# Patient Record
Sex: Male | Born: 1980 | Race: White | Hispanic: No | Marital: Single
Health system: Southern US, Community
[De-identification: ages and names within clinical notes are randomized; demographics above are authoritative.]

## PROBLEM LIST (undated history)

## (undated) DIAGNOSIS — I10 Essential (primary) hypertension: Secondary | ICD-10-CM

## (undated) HISTORY — PX: CLAVICLE SURGERY: SHX598

---
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
## 1999-09-30 HISTORY — PX: MENISCUS REPAIR: SHX5179

## 2006-12-12 ENCOUNTER — Emergency Department (HOSPITAL_COMMUNITY): Admission: EM | Admit: 2006-12-12 | Discharge: 2006-12-12 | Payer: Self-pay | Admitting: Family Medicine

## 2013-10-28 ENCOUNTER — Ambulatory Visit: Payer: Self-pay | Admitting: Internal Medicine

## 2013-10-28 LAB — IRON AND TIBC
IRON BIND. CAP.(TOTAL): 330 ug/dL (ref 250–450)
IRON: 53 ug/dL — AB (ref 65–175)
Iron Saturation: 16 %
UNBOUND IRON-BIND. CAP.: 277 ug/dL

## 2013-10-28 LAB — HEPATIC FUNCTION PANEL A (ARMC)
ALT: 49 U/L (ref 12–78)
AST: 28 U/L (ref 15–37)
Albumin: 3.6 g/dL (ref 3.4–5.0)
Alkaline Phosphatase: 75 U/L
Bilirubin, Direct: 0.1 mg/dL (ref 0.00–0.20)
Bilirubin,Total: 0.4 mg/dL (ref 0.2–1.0)
Total Protein: 7.5 g/dL (ref 6.4–8.2)

## 2013-10-28 LAB — CBC CANCER CENTER
BASOS PCT: 0.3 %
Basophil #: 0 x10 3/mm (ref 0.0–0.1)
EOS ABS: 0.1 x10 3/mm (ref 0.0–0.7)
Eosinophil %: 1.3 %
HCT: 38.8 % — ABNORMAL LOW (ref 40.0–52.0)
HGB: 13.5 g/dL (ref 13.0–18.0)
Lymphocyte #: 1.9 x10 3/mm (ref 1.0–3.6)
Lymphocyte %: 20.2 %
MCH: 30.6 pg (ref 26.0–34.0)
MCHC: 34.9 g/dL (ref 32.0–36.0)
MCV: 88 fL (ref 80–100)
MONOS PCT: 5.2 %
Monocyte #: 0.5 x10 3/mm (ref 0.2–1.0)
Neutrophil #: 6.7 x10 3/mm — ABNORMAL HIGH (ref 1.4–6.5)
Neutrophil %: 73 %
PLATELETS: 276 x10 3/mm (ref 150–440)
RBC: 4.42 10*6/uL (ref 4.40–5.90)
RDW: 14 % (ref 11.5–14.5)
WBC: 9.2 x10 3/mm (ref 3.8–10.6)

## 2013-10-28 LAB — FERRITIN: FERRITIN (ARMC): 219 ng/mL (ref 8–388)

## 2013-10-28 LAB — CALCIUM: CALCIUM: 9.1 mg/dL (ref 8.5–10.1)

## 2013-10-28 LAB — LACTATE DEHYDROGENASE: LDH: 183 U/L (ref 85–241)

## 2013-10-28 LAB — SEDIMENTATION RATE: Erythrocyte Sed Rate: 25 mm/hr — ABNORMAL HIGH (ref 0–15)

## 2013-10-28 LAB — CREATININE, SERUM
Creatinine: 1.1 mg/dL (ref 0.60–1.30)
EGFR (African American): >60

## 2013-10-30 ENCOUNTER — Ambulatory Visit: Payer: Self-pay | Admitting: Internal Medicine

## 2013-10-31 LAB — PROT IMMUNOELECTROPHORES(ARMC)

## 2013-11-27 ENCOUNTER — Ambulatory Visit: Payer: Self-pay | Admitting: Internal Medicine

## 2014-03-29 ENCOUNTER — Ambulatory Visit: Payer: Self-pay | Admitting: Internal Medicine

## 2014-04-29 ENCOUNTER — Ambulatory Visit: Payer: Self-pay | Admitting: Internal Medicine

## 2014-05-11 ENCOUNTER — Ambulatory Visit: Payer: Self-pay | Admitting: Internal Medicine

## 2014-05-11 LAB — CBC CANCER CENTER
BASOS ABS: 0 x10 3/mm (ref 0.0–0.1)
Basophil %: 0.5 %
EOS PCT: 2.3 %
Eosinophil #: 0.1 x10 3/mm (ref 0.0–0.7)
HCT: 40.9 % (ref 40.0–52.0)
HGB: 13.9 g/dL (ref 13.0–18.0)
LYMPHS ABS: 1.7 x10 3/mm (ref 1.0–3.6)
LYMPHS PCT: 27.4 %
MCH: 30.1 pg (ref 26.0–34.0)
MCHC: 33.9 g/dL (ref 32.0–36.0)
MCV: 89 fL (ref 80–100)
Monocyte #: 0.3 x10 3/mm (ref 0.2–1.0)
Monocyte %: 4.8 %
Neutrophil #: 4.1 x10 3/mm (ref 1.4–6.5)
Neutrophil %: 65 %
Platelet: 255 x10 3/mm (ref 150–440)
RBC: 4.62 10*6/uL (ref 4.40–5.90)
RDW: 13.9 % (ref 11.5–14.5)
WBC: 6.3 x10 3/mm (ref 3.8–10.6)

## 2014-05-11 LAB — IRON AND TIBC
IRON BIND. CAP.(TOTAL): 313 ug/dL (ref 250–450)
Iron Saturation: 31 %
Iron: 97 ug/dL (ref 65–175)
Unbound Iron-Bind.Cap.: 216 ug/dL

## 2014-05-11 LAB — SEDIMENTATION RATE: Erythrocyte Sed Rate: 16 mm/hr — ABNORMAL HIGH (ref 0–15)

## 2014-05-11 LAB — FERRITIN: Ferritin (ARMC): 217 ng/mL (ref 8–388)

## 2014-05-30 ENCOUNTER — Ambulatory Visit: Payer: Self-pay | Admitting: Internal Medicine

## 2019-11-26 ENCOUNTER — Ambulatory Visit: Payer: Self-pay | Attending: Internal Medicine

## 2019-11-26 DIAGNOSIS — Z23 Encounter for immunization: Secondary | ICD-10-CM | POA: Insufficient documentation

## 2019-11-26 NOTE — Progress Notes (Signed)
   Covid-19 Vaccination Clinic  Name:  David Pope    MRN: [id number] DOB: 07-02-1981  11/26/2019  David Pope was observed post Covid-19 immunization for 15 minutes without incidence. He was provided with Vaccine Information Sheet and instruction to access the V-Safe system.   David Pope was instructed to call 911 with any severe reactions post vaccine: [name] Kitchen Difficulty breathing  . Swelling of your face and throat  . A fast heartbeat  . A bad rash all over your body  . Dizziness and weakness    Immunizations Administered    Name Date Dose VIS Date Route   Moderna COVID-19 Vaccine 11/26/2019  2:39 PM 0.5 mL 08/30/2019 Intramuscular   Manufacturer: Moderna   Lot: [id number]   [place]:[id number]

## 2019-12-24 ENCOUNTER — Ambulatory Visit: Payer: Self-pay | Attending: Internal Medicine

## 2019-12-24 DIAGNOSIS — Z23 Encounter for immunization: Secondary | ICD-10-CM

## 2019-12-24 NOTE — Progress Notes (Signed)
   Covid-19 Vaccination Clinic  Name:  David Pope    MRN: [id number] DOB: [birth year]  12/24/2019  [name] was observed post Covid-19 immunization for 15 minutes without incident. He was provided with Vaccine Information Sheet and instruction to access the V-Safe system.   [name] was instructed to call 911 with any severe reactions post vaccine: [name] Kitchen Difficulty breathing  . Swelling of face and throat  . A fast heartbeat  . A bad rash all over body  . Dizziness and weakness   Immunizations Administered    Name Date Dose VIS Date Route   Moderna COVID-19 Vaccine 12/24/2019 11:50 AM 0.5 mL 08/30/2019 Intramuscular   Manufacturer: Levan Hurst   Lot: [id number]   [place]: [id number]

## 2020-03-07 ENCOUNTER — Other Ambulatory Visit: Payer: Self-pay

## 2020-03-07 ENCOUNTER — Ambulatory Visit: Payer: BC Managed Care – PPO | Admitting: Dermatology

## 2020-03-07 DIAGNOSIS — Z1283 Encounter for screening for malignant neoplasm of skin: Secondary | ICD-10-CM

## 2020-03-07 DIAGNOSIS — D2362 Other benign neoplasm of skin of left upper limb, including shoulder: Secondary | ICD-10-CM | POA: Diagnosis not present

## 2020-03-07 DIAGNOSIS — L578 Other skin changes due to chronic exposure to nonionizing radiation: Secondary | ICD-10-CM

## 2020-03-07 DIAGNOSIS — D485 Neoplasm of uncertain behavior of skin: Secondary | ICD-10-CM

## 2020-03-07 DIAGNOSIS — L738 Other specified follicular disorders: Secondary | ICD-10-CM

## 2020-03-07 DIAGNOSIS — D239 Other benign neoplasm of skin, unspecified: Secondary | ICD-10-CM

## 2020-03-07 DIAGNOSIS — D225 Melanocytic nevi of trunk: Secondary | ICD-10-CM

## 2020-03-07 DIAGNOSIS — L814 Other melanin hyperpigmentation: Secondary | ICD-10-CM

## 2020-03-07 DIAGNOSIS — D1722 Benign lipomatous neoplasm of skin and subcutaneous tissue of left arm: Secondary | ICD-10-CM | POA: Diagnosis not present

## 2020-03-07 DIAGNOSIS — D229 Melanocytic nevi, unspecified: Secondary | ICD-10-CM | POA: Diagnosis not present

## 2020-03-07 DIAGNOSIS — L821 Other seborrheic keratosis: Secondary | ICD-10-CM

## 2020-03-07 DIAGNOSIS — D1801 Hemangioma of skin and subcutaneous tissue: Secondary | ICD-10-CM

## 2020-03-07 HISTORY — DX: Melanocytic nevi, unspecified: D22.9

## 2020-03-07 NOTE — Progress Notes (Signed)
   New Patient Visit  Subjective  David Pope is a 39 y.o. male who presents for the following: Annual Exam (UBSE - No history of abnormal moles or skin cancer. He was adopted so he is not aware of family history.). The patient presents for Upper Body Skin Exam (UBSE) for skin cancer screening and mole check.  The following portions of the chart were reviewed this encounter and updated as appropriate:  Allergies  Meds  Problems  Med Hx  Surg Hx  Fam Hx      Review of Systems:  No other skin or systemic complaints except as noted in HPI or Assessment and Plan.  Objective  Well appearing patient in no apparent distress; mood and affect are within normal limits.  All skin waist up examined.  Objective  Left upper arm: Firm pink/brown papulenodule with dimple sign.   Objective  Left post deltoid: Rubbery nodule.  Objective  Above right lat waistline: 0.8 x 0.4 cm dark brown macule   Assessment & Plan    Sebaceous Hyperplasia - Small yellow papules with a central dell - Benign - Observe  Lentigines - Scattered tan macules - Discussed due to sun exposure - Benign, observe - Call for any changes  Seborrheic Keratoses - Stuck-on, waxy, tan-brown papules and plaques  - Discussed benign etiology and prognosis. - Observe - Call for any changes  Melanocytic Nevi - Tan-brown and/or pink-flesh-colored symmetric macules and papules - Benign appearing on exam today - Observation - Call clinic for new or changing moles - Recommend daily use of broad spectrum spf 30+ sunscreen to sun-exposed areas.   Hemangiomas - Red papules - Discussed benign nature - Observe - Call for any changes  Actinic Damage - diffuse scaly erythematous macules with underlying dyspigmentation - Recommend daily broad spectrum sunscreen SPF 30+ to sun-exposed areas, reapply every 2 hours as needed.  - Call for new or changing lesions.  Skin cancer screening performed  today.   Dermatofibroma Left upper arm  Benign, observe.    Lipoma of left upper extremity Left post deltoid  Lipoma vs cyst - Benign appearing. Observe.  Neoplasm of uncertain behavior of skin Above right lat waistline  Epidermal / dermal shaving  Lesion length (cm):  0.8 Lesion width (cm):  0.4 Margin per side (cm):  0.2 Total excision diameter (cm):  1.2 Informed consent: discussed and consent obtained   Timeout: patient name, date of birth, surgical site, and procedure verified   Procedure prep:  Patient was prepped and draped in usual sterile fashion Prep type:  Isopropyl alcohol Anesthesia: the lesion was anesthetized in a standard fashion   Anesthetic:  1% lidocaine w/ epinephrine 1-100,000 buffered w/ 8.4% NaHCO3 Instrument used: flexible razor blade   Hemostasis achieved with: pressure, aluminum chloride and electrodesiccation   Outcome: patient tolerated procedure well   Post-procedure details: sterile dressing applied and wound care instructions given   Dressing type: bandage and petrolatum    Specimen 1 - Surgical pathology Differential Diagnosis: Nevus vs dysplastic nevus Check Margins: No 0.8 x 0.4 cm dark brown macule  Skin cancer screening  Return in 1 year (on 03/07/2021).   I, Ashok Cordia, CMA, am acting as scribe for Sarina Ser, MD .  Documentation: I have reviewed the above documentation for accuracy and completeness, and I agree with the above.  Sarina Ser, MD

## 2020-03-07 NOTE — Patient Instructions (Signed)

## 2020-03-13 ENCOUNTER — Encounter: Payer: Self-pay | Admitting: Dermatology

## 2020-03-13 ENCOUNTER — Telehealth: Payer: Self-pay

## 2020-03-13 NOTE — Telephone Encounter (Signed)
Left message on voicemail to return my call.  

## 2020-03-13 NOTE — Telephone Encounter (Signed)
-----   Message from Ralene Bathe, MD sent at 03/12/2020  6:06 PM EDT ----- Skin , above right lat waistline DYSPLASTIC NEVUS WITH MODERATE TO SEVERE ATYPIA  Dysplastic Moderate to severe May need additional procedure. Schedule appt to recheck in 3-6 mos

## 2020-03-20 ENCOUNTER — Telehealth: Payer: Self-pay

## 2020-03-20 NOTE — Telephone Encounter (Signed)
-----   Message from Ralene Bathe, MD sent at 03/12/2020  6:06 PM EDT ----- Skin , above right lat waistline DYSPLASTIC NEVUS WITH MODERATE TO SEVERE ATYPIA  Dysplastic Moderate to severe May need additional procedure. Schedule appt to recheck in 3-6 mos

## 2020-03-20 NOTE — Telephone Encounter (Signed)
Patient informed of pathology results and appointment scheduled.  °

## 2020-08-21 ENCOUNTER — Encounter: Payer: Self-pay | Admitting: Dermatology

## 2020-08-21 ENCOUNTER — Ambulatory Visit: Payer: BC Managed Care – PPO | Admitting: Dermatology

## 2020-08-21 ENCOUNTER — Other Ambulatory Visit: Payer: Self-pay

## 2020-08-21 DIAGNOSIS — D229 Melanocytic nevi, unspecified: Secondary | ICD-10-CM

## 2020-08-21 DIAGNOSIS — L821 Other seborrheic keratosis: Secondary | ICD-10-CM

## 2020-08-21 DIAGNOSIS — D2362 Other benign neoplasm of skin of left upper limb, including shoulder: Secondary | ICD-10-CM | POA: Diagnosis not present

## 2020-08-21 DIAGNOSIS — Z1283 Encounter for screening for malignant neoplasm of skin: Secondary | ICD-10-CM

## 2020-08-21 DIAGNOSIS — L814 Other melanin hyperpigmentation: Secondary | ICD-10-CM | POA: Diagnosis not present

## 2020-08-21 DIAGNOSIS — D239 Other benign neoplasm of skin, unspecified: Secondary | ICD-10-CM

## 2020-08-21 DIAGNOSIS — D492 Neoplasm of unspecified behavior of bone, soft tissue, and skin: Secondary | ICD-10-CM

## 2020-08-21 DIAGNOSIS — Z86018 Personal history of other benign neoplasm: Secondary | ICD-10-CM

## 2020-08-21 DIAGNOSIS — D1800 Hemangioma unspecified site: Secondary | ICD-10-CM

## 2020-08-21 DIAGNOSIS — L578 Other skin changes due to chronic exposure to nonionizing radiation: Secondary | ICD-10-CM

## 2020-08-21 DIAGNOSIS — D2272 Melanocytic nevi of left lower limb, including hip: Secondary | ICD-10-CM | POA: Diagnosis not present

## 2020-08-21 MED ORDER — MUPIROCIN 2 % EX OINT: TOPICAL_OINTMENT | CUTANEOUS | 1 refills | Status: AC

## 2020-08-21 NOTE — Progress Notes (Signed)
Follow-Up Visit   Subjective  David Pope is a 39 y.o. male who presents for the following: Follow-up (above R lateral waistline hx of Dysplastic nevus ) and Annual Exam (Pt present for TBSe ). The patient presents for Total-Body Skin Exam (TBSE) for skin cancer screening and mole check.  The following portions of the chart were reviewed this encounter and updated as appropriate:  Allergies  Meds  Problems  Med Hx  Surg Hx  Fam Hx     Review of Systems:  No other skin or systemic complaints except as noted in HPI or Assessment and Plan.  Objective  Well appearing patient in no apparent distress; mood and affect are within normal limits.  A full examination was performed including scalp, head, eyes, ears, nose, lips, neck, chest, axillae, abdomen, back, buttocks, bilateral upper extremities, bilateral lower extremities, hands, feet, fingers, toes, fingernails, and toenails. All findings within normal limits unless otherwise noted below.  Objective  Left bicep,: Firm pink/brown papulenodule with dimple sign.   Objective  above Right lateral waistline: Scar with no evidence of recurrence.   Objective  Left dorsum great toe: 0.4 cm irregular brown macule         Assessment & Plan  Dermatofibroma Left bicep,  Benign-appearing.  Observation.  Call clinic for new or changing moles.  Recommend daily use of broad spectrum spf 30+ sunscreen to sun-exposed areas.    History of dysplastic nevus above Right lateral waistline  Clear. Observe for recurrence. Call clinic for new or changing lesions.  Recommend regular skin exams, daily broad-spectrum spf 30+ sunscreen use, and photoprotection.     Ordered Medications: mupirocin ointment (BACTROBAN) 2 %  Neoplasm of skin Left dorsum great toe  Epidermal / dermal shaving  Lesion diameter (cm):  0.4 Informed consent: discussed and consent obtained   Timeout: patient name, date of birth, surgical site, and procedure  verified   Procedure prep:  Patient was prepped and draped in usual sterile fashion Prep type:  Isopropyl alcohol Anesthesia: the lesion was anesthetized in a standard fashion   Anesthetic:  1% lidocaine w/ epinephrine 1-100,000 buffered w/ 8.4% NaHCO3 Hemostasis achieved with: pressure, aluminum chloride and electrodesiccation   Outcome: patient tolerated procedure well   Post-procedure details: sterile dressing applied and wound care instructions given   Dressing type: bandage and petrolatum    Specimen 1 - Surgical pathology Differential Diagnosis: R/O Dysplastic nevus  Check Margins: No 0.4 cm irregular brown macule  Skin cancer screening  Lentigines - Scattered tan macules - Discussed due to sun exposure - Benign, observe - Call for any changes  Seborrheic Keratoses - Stuck-on, waxy, tan-brown papules and plaques  - Discussed benign etiology and prognosis. - Observe - Call for any changes  Melanocytic Nevi - Tan-brown and/or pink-flesh-colored symmetric macules and papules - Benign appearing on exam today - Observation - Call clinic for new or changing moles - Recommend daily use of broad spectrum spf 30+ sunscreen to sun-exposed areas.   Hemangiomas - Red papules - Discussed benign nature - Observe - Call for any changes  Actinic Damage - Chronic, secondary to cumulative UV/sun exposure - diffuse scaly erythematous macules with underlying dyspigmentation - Recommend daily broad spectrum sunscreen SPF 30+ to sun-exposed areas, reapply every 2 hours as needed.  - Call for new or changing lesions.  Skin cancer screening performed today.  Return in about 1 year (around 08/21/2021) for TBSE.  I, Marye Round, CMA, am acting as Education administrator for [employer]  David Massed, MD .  Documentation: I have reviewed the above documentation for accuracy and completeness, and I agree with the above.  David Ser, MD

## 2020-08-21 NOTE — Patient Instructions (Signed)

## 2020-08-29 ENCOUNTER — Telehealth: Payer: Self-pay

## 2020-08-29 ENCOUNTER — Encounter: Payer: Self-pay | Admitting: Dermatology

## 2020-08-29 NOTE — Telephone Encounter (Signed)
-----   Message from Ralene Bathe, MD sent at 08/28/2020  9:21 AM EST ----- Diagnosis Skin , left dorsum great toe MELANOCYTIC NEVUS, INTRADERMAL TYPE  Benign mole No further treatment needed

## 2020-08-29 NOTE — Telephone Encounter (Signed)
Left message on voicemail to return my call.  

## 2020-09-11 ENCOUNTER — Telehealth: Payer: Self-pay

## 2020-09-11 NOTE — Telephone Encounter (Signed)
-----   Message from Ralene Bathe, MD sent at 08/28/2020  9:21 AM EST ----- Diagnosis Skin , left dorsum great toe MELANOCYTIC NEVUS, INTRADERMAL TYPE  Benign mole No further treatment needed

## 2020-09-11 NOTE — Telephone Encounter (Signed)
Left message on voicemail to return my call.  

## 2020-09-18 ENCOUNTER — Telehealth: Payer: Self-pay

## 2020-09-18 NOTE — Telephone Encounter (Signed)
-----   Message from David C Kowalski, MD sent at 08/28/2020  9:21 AM EST ----- °Diagnosis °Skin , left dorsum great toe °MELANOCYTIC NEVUS, INTRADERMAL TYPE ° °Benign mole °No further treatment needed °

## 2020-09-18 NOTE — Telephone Encounter (Signed)
Patient informed of pathology results 

## 2021-03-07 ENCOUNTER — Ambulatory Visit (INDEPENDENT_AMBULATORY_CARE_PROVIDER_SITE_OTHER): Payer: BC Managed Care – PPO | Admitting: Dermatology

## 2021-03-07 ENCOUNTER — Other Ambulatory Visit: Payer: Self-pay

## 2021-03-07 DIAGNOSIS — Z1283 Encounter for screening for malignant neoplasm of skin: Secondary | ICD-10-CM | POA: Diagnosis not present

## 2021-03-07 DIAGNOSIS — L821 Other seborrheic keratosis: Secondary | ICD-10-CM

## 2021-03-07 DIAGNOSIS — D2362 Other benign neoplasm of skin of left upper limb, including shoulder: Secondary | ICD-10-CM | POA: Diagnosis not present

## 2021-03-07 DIAGNOSIS — L814 Other melanin hyperpigmentation: Secondary | ICD-10-CM | POA: Diagnosis not present

## 2021-03-07 DIAGNOSIS — L578 Other skin changes due to chronic exposure to nonionizing radiation: Secondary | ICD-10-CM

## 2021-03-07 DIAGNOSIS — D1800 Hemangioma unspecified site: Secondary | ICD-10-CM

## 2021-03-07 DIAGNOSIS — D239 Other benign neoplasm of skin, unspecified: Secondary | ICD-10-CM

## 2021-03-07 DIAGNOSIS — Z86018 Personal history of other benign neoplasm: Secondary | ICD-10-CM

## 2021-03-07 DIAGNOSIS — D229 Melanocytic nevi, unspecified: Secondary | ICD-10-CM

## 2021-03-07 NOTE — Patient Instructions (Signed)

## 2021-03-07 NOTE — Progress Notes (Signed)
   Follow-Up Visit   Subjective  David Pope is a 40 y.o. male who presents for the following: Total body skin exam (Hx of dysplastic nevus R lat waistline). The patient presents for Total-Body Skin Exam (TBSE) for skin cancer screening and mole check.  The following portions of the chart were reviewed this encounter and updated as appropriate:   Allergies  Meds  Problems  Med Hx  Surg Hx  Fam Hx      Review of Systems:  No other skin or systemic complaints except as noted in HPI or Assessment and Plan.  Objective  Well appearing patient in no apparent distress; mood and affect are within normal limits.  A full examination was performed including scalp, head, eyes, ears, nose, lips, neck, chest, axillae, abdomen, back, buttocks, bilateral upper extremities, bilateral lower extremities, hands, feet, fingers, toes, fingernails, and toenails. All findings within normal limits unless otherwise noted below.  Left Upper Arm - Anterior Firm pink/brown papulenodule with dimple sign.   R lat waistline Scar with no evidence of recurrence.    Assessment & Plan   Lentigines - Scattered tan macules - Due to sun exposure - Benign-appering, observe - Recommend daily broad spectrum sunscreen SPF 30+ to sun-exposed areas, reapply every 2 hours as needed. - Call for any changes  Seborrheic Keratoses - Stuck-on, waxy, tan-brown papules and/or plaques  - Benign-appearing - Discussed benign etiology and prognosis. - Observe - Call for any changes  Melanocytic Nevi - Tan-brown and/or pink-flesh-colored symmetric macules and papules - Benign appearing on exam today - Observation - Call clinic for new or changing moles - Recommend daily use of broad spectrum spf 30+ sunscreen to sun-exposed areas.   Hemangiomas - Red papules - Discussed benign nature - Observe - Call for any changes  Actinic Damage - Chronic condition, secondary to cumulative UV/sun exposure - diffuse scaly  erythematous macules with underlying dyspigmentation - Recommend daily broad spectrum sunscreen SPF 30+ to sun-exposed areas, reapply every 2 hours as needed.  - Staying in the shade or wearing long sleeves, sun glasses (UVA+UVB protection) and wide brim hats (4-inch brim around the entire circumference of the hat) are also recommended for sun protection.  - Call for new or changing lesions.  Skin cancer screening performed today.  Dermatofibroma Left Upper Arm - Anterior  Benign, observe.    History of dysplastic nevus R lat waistline  Clear. Observe for recurrence. Call clinic for new or changing lesions.  Recommend regular skin exams, daily broad-spectrum spf 30+ sunscreen use, and photoprotection.     Related Medications mupirocin ointment (BACTROBAN) 2 % Apply to skin qd-bid  Return in about 1 year (around 03/07/2022) for TBSE, Hx of Dysplastic nevi.  I, Othelia Pulling, RMA, am acting as scribe for Sarina Ser, MD . Documentation: I have reviewed the above documentation for accuracy and completeness, and I agree with the above.  Sarina Ser, MD

## 2021-03-12 ENCOUNTER — Encounter: Payer: Self-pay | Admitting: Dermatology

## 2022-03-12 ENCOUNTER — Ambulatory Visit: Payer: BC Managed Care – PPO | Admitting: Dermatology

## 2022-03-12 ENCOUNTER — Encounter: Payer: Self-pay | Admitting: Dermatology

## 2022-03-12 DIAGNOSIS — Z86018 Personal history of other benign neoplasm: Secondary | ICD-10-CM | POA: Diagnosis not present

## 2022-03-12 DIAGNOSIS — L821 Other seborrheic keratosis: Secondary | ICD-10-CM

## 2022-03-12 DIAGNOSIS — D229 Melanocytic nevi, unspecified: Secondary | ICD-10-CM

## 2022-03-12 DIAGNOSIS — D2362 Other benign neoplasm of skin of left upper limb, including shoulder: Secondary | ICD-10-CM

## 2022-03-12 DIAGNOSIS — L814 Other melanin hyperpigmentation: Secondary | ICD-10-CM

## 2022-03-12 DIAGNOSIS — L570 Actinic keratosis: Secondary | ICD-10-CM

## 2022-03-12 DIAGNOSIS — Z1283 Encounter for screening for malignant neoplasm of skin: Secondary | ICD-10-CM | POA: Diagnosis not present

## 2022-03-12 DIAGNOSIS — L578 Other skin changes due to chronic exposure to nonionizing radiation: Secondary | ICD-10-CM

## 2022-03-12 DIAGNOSIS — L409 Psoriasis, unspecified: Secondary | ICD-10-CM | POA: Diagnosis not present

## 2022-03-12 DIAGNOSIS — D1800 Hemangioma unspecified site: Secondary | ICD-10-CM

## 2022-03-12 NOTE — Progress Notes (Signed)
Follow-Up Visit   Subjective  David Pope is a 41 y.o. male who presents for the following: Annual Exam (Skin cancer screening. Full body. Hx of dysplastic nevus with severe atypia). The patient presents for Total-Body Skin Exam (TBSE) for skin cancer screening and mole check.  The patient has spots, moles and lesions to be evaluated, some may be new or changing and the patient has concerns that these could be cancer.  The following portions of the chart were reviewed this encounter and updated as appropriate:  Allergies  Meds  Problems  Med Hx  Surg Hx  Fam Hx     Review of Systems: No other skin or systemic complaints except as noted in HPI or Assessment and Plan.  Objective  Well appearing patient in no apparent distress; mood and affect are within normal limits.  A full examination was performed including scalp, head, eyes, ears, nose, lips, neck, chest, axillae, abdomen, back, buttocks, bilateral upper extremities, bilateral lower extremities, hands, feet, fingers, toes, fingernails, and toenails. All findings within normal limits unless otherwise noted below.  right ear helix x1 Erythematous thin papules/macules with gritty scale.   B/L Elbows and knees Hyperkeratotic, lichenified plaques   Assessment & Plan   History of Dysplastic Nevus. Above right lateral waistline. Mod-severe atypia, margins free. 03/07/2020. - No evidence of recurrence today - Recommend regular full body skin exams - Recommend daily broad spectrum sunscreen SPF 30+ to sun-exposed areas, reapply every 2 hours as needed.  - Call if any new or changing lesions are noted between office visits   Lentigines - Scattered tan macules - Due to sun exposure - Benign-appearing, observe - Recommend daily broad spectrum sunscreen SPF 30+ to sun-exposed areas, reapply every 2 hours as needed. - Call for any changes  Seborrheic Keratoses - Stuck-on, waxy, tan-brown papules and/or plaques  -  Benign-appearing - Discussed benign etiology and prognosis. - Observe - Call for any changes  Melanocytic Nevi - Tan-brown and/or pink-flesh-colored symmetric macules and papules - Benign appearing on exam today - Observation - Call clinic for new or changing moles - Recommend daily use of broad spectrum spf 30+ sunscreen to sun-exposed areas.   Hemangiomas - Red papules - Discussed benign nature - Observe - Call for any changes  Actinic Damage - Chronic condition, secondary to cumulative UV/sun exposure - diffuse scaly erythematous macules with underlying dyspigmentation - Recommend daily broad spectrum sunscreen SPF 30+ to sun-exposed areas, reapply every 2 hours as needed.  - Staying in the shade or wearing long sleeves, sun glasses (UVA+UVB protection) and wide brim hats (4-inch brim around the entire circumference of the hat) are also recommended for sun protection.  - Call for new or changing lesions.  Dermatofibroma. Left upper arm, left deltoid. - Firm pink/brown papulenodule with dimple sign - Benign appearing - Call for any changes  Skin cancer screening performed today.  AK (actinic keratosis) right ear helix x1 Actinic keratoses are precancerous spots that appear secondary to cumulative UV radiation exposure/sun exposure over time. They are chronic with expected duration over 1 year. A portion of actinic keratoses will progress to squamous cell carcinoma of the skin. It is not possible to reliably predict which spots will progress to skin cancer and so treatment is recommended to prevent development of skin cancer. Recommend daily broad spectrum sunscreen SPF 30+ to sun-exposed areas, reapply every 2 hours as needed.  Recommend staying in the shade or wearing long sleeves, sun glasses (UVA+UVB protection) and wide  brim hats (4-inch brim around the entire circumference of the hat). Call for new or changing lesions.  Destruction of lesion - right ear helix  x1 Complexity: simple   Destruction method: cryotherapy   Informed consent: discussed and consent obtained   Timeout:  patient name, date of birth, surgical site, and procedure verified Lesion destroyed using liquid nitrogen: Yes   Region frozen until ice ball extended beyond lesion: Yes   Outcome: patient tolerated procedure well with no complications   Post-procedure details: wound care instructions given   Additional details:  Prior to procedure, discussed risks of blister formation, small wound, skin dyspigmentation, or rare scar following cryotherapy. Recommend Vaseline ointment to treated areas while healing.  Psoriasis B/L Elbows and knees Psoriasis is a chronic non-curable, but treatable genetic/hereditary disease that may have other systemic features affecting other organ systems such as joints (Psoriatic Arthritis). It is associated with an increased risk of inflammatory bowel disease, heart disease, non-alcoholic fatty liver disease, and depression.   No treatment necessary today. Not bothersome to patient.   Return in about 1 year (around 03/13/2023) for TBSE, HxDN.  I, Emelia Salisbury, CMA, am acting as scribe for Sarina Ser, MD. Documentation: I have reviewed the above documentation for accuracy and completeness, and I agree with the above.  Sarina Ser, MD

## 2022-03-12 NOTE — Patient Instructions (Addendum)
Cryotherapy Aftercare  Wash gently with soap and water everyday.   Apply Vaseline daily until healed.   Prior to procedure, discussed risks of blister formation, small wound, skin dyspigmentation, or rare scar following cryotherapy. Recommend Vaseline ointment to treated areas while healing.    Recommend daily broad spectrum sunscreen SPF 30+ to sun-exposed areas, reapply every 2 hours as needed. Call for new or changing lesions.  Staying in the shade or wearing long sleeves, sun glasses (UVA+UVB protection) and wide brim hats (4-inch brim around the entire circumference of the hat) are also recommended for sun protection.      Melanoma ABCDEs  Melanoma is the most dangerous type of skin cancer, and is the leading cause of death from skin disease.  You are more likely to develop melanoma if you: Have light-colored skin, light-colored eyes, or red or blond hair Spend a lot of time in the sun Tan regularly, either outdoors or in a tanning bed Have had blistering sunburns, especially during childhood Have a close family member who has had a melanoma Have atypical moles or large birthmarks  Early detection of melanoma is key since treatment is typically straightforward and cure rates are extremely high if we catch it early.   The first sign of melanoma is often a change in a mole or a new dark spot.  The ABCDE system is a way of remembering the signs of melanoma.  A for asymmetry:  The two halves do not match. B for border:  The edges of the growth are irregular. C for color:  A mixture of colors are present instead of an even brown color. D for diameter:  Melanomas are usually (but not always) greater than 21m - the size of a pencil eraser. E for evolution:  The spot keeps changing in size, shape, and color.  Please check your skin once per month between visits. You can use a small mirror in front and a large mirror behind you to keep an eye on the back side or your body.   If you  see any new or changing lesions before your next follow-up, please call to schedule a visit.  Please continue daily skin protection including broad spectrum sunscreen SPF 30+ to sun-exposed areas, reapplying every 2 hours as needed when you're outdoors.   Staying in the shade or wearing long sleeves, sun glasses (UVA+UVB protection) and wide brim hats (4-inch brim around the entire circumference of the hat) are also recommended for sun protection.     Due to recent changes in healthcare laws, you may see results of your pathology and/or laboratory studies on MyChart before the doctors have had a chance to review them. We understand that in some cases there may be results that are confusing or concerning to you. Please understand that not all results are received at the same time and often the doctors may need to interpret multiple results in order to provide you with the best plan of care or course of treatment. Therefore, we ask that you please give u[place]2 business days to thoroughly review all your results before contacting the office for clarification. Should we see a critical lab result, you will be contacted sooner.   If You Need Anything After Your Visit  If you have any questions or concerns for your doctor, please call our main line at [phone] press option 4 to reach your doctor's medical assistant. If no one answers, please leave a voicemail as directed and we will return  your call as soon as possible. Messages left after 4 pm will be answered the following business day.   You may also send [place] a message via [place]. We typically respond to MyChart messages within 1-2 business days.  For prescription refills, please ask your pharmacy to contact our office. Our fax number is [phone].  If you have an urgent issue when the clinic is closed that cannot wait until the next business day, you can page your doctor at the number below.    Please note that while we do our best to be  available for urgent issues outside of office hours, we are not available 24/7.   If you have an urgent issue and are unable to reach [place], you may choose to seek medical care at your doctor's office, retail clinic, urgent care center, or emergency room.  If you have a medical emergency, please immediately call 911 or go to the emergency department.  Pager Numbers  - Dr. Nehemiah Massed: [phone]  - Dr. Laurence Ferrari: [phone]  - Dr. Nicole Kindred: [phone]  In the event of inclement weather, please call our main line at [phone] for an update on the status of any delays or closures.  Dermatology Medication Tips: Please keep the boxes that topical medications come in in order to help keep track of the instructions about where and how to use these. Pharmacies typically print the medication instructions only on the boxes and not directly on the medication tubes.   If your medication is too expensive, please contact our office at [phone] option 4 or send [place] a message through [place].   We are unable to tell what your co-pay for medications will be in advance as this is different depending on your insurance coverage. However, we may be able to find a substitute medication at lower cost or fill out paperwork to get insurance to cover a needed medication.   If a prior authorization is required to get your medication covered by your insurance company, please allow [place] 1-2 business days to complete this process.  Drug prices often vary depending on where the prescription is filled and some pharmacies may offer cheaper prices.  The website www.goodrx.com contains coupons for medications through different pharmacies. The prices here do not account for what the cost may be with help from insurance (it may be cheaper with your insurance), but the website can give you the price if you did not use any insurance.  - You can print the associated coupon and take it with your prescription to the pharmacy.  -  You may also stop by our office during regular business hours and pick up a GoodRx coupon card.  - If you need your prescription sent electronically to a different pharmacy, notify our office through [facility] or by phone at [phone] option 4.     Si Usted Necesita Algo Despus de Su Visita  Tambin puede enviarnos un mensaje a travs de Pharmacist, community. Por lo general respondemos a los mensajes de MyChart en el transcurso de 1 a 2 das hbiles.  Para renovar recetas, por favor pida a su farmacia que se ponga en contacto con nuestra oficina. Harland Dingwall de fax es [place] [phone].  Si tiene un asunto urgente cuando la clnica est cerrada y que no puede esperar hasta el siguiente da hbil, puede llamar/localizar a su doctor(a) al nmero que aparece a continuacin.   Por favor, tenga en cuenta que aunque hacemos todo lo posible para estar disponibles para asuntos urgentes  fuera del horario de oficina, no estamos disponibles las 24 horas del da, los 7 das de la [place].   Si tiene un problema urgente y no puede comunicarse con nosotros, puede optar por buscar atencin mdica  en el consultorio de su doctor(a), en una clnica privada, en un centro de atencin urgente o en una sala de emergencias.  Si tiene Engineering geologist, por favor llame inmediatamente al 911 o vaya a la sala de emergencias.  Nmeros de bper  - Dr. Nehemiah Massed: [phone]  - Dra. Moye: [phone]  - Dra. Nicole Kindred: [phone]  En caso de inclemencias del [place], por favor llame a Johnsie Kindred principal al [phone] para una actualizacin sobre el [place] de cualquier retraso o cierre.  Consejos para la medicacin en dermatologa: Por favor, guarde las cajas en las que vienen los medicamentos de uso tpico para ayudarle a seguir las instrucciones sobre dnde y cmo usarlos. Las farmacias generalmente imprimen las instrucciones del medicamento slo en las cajas y no directamente en los tubos del  [place].   Si su medicamento es muy caro, por favor, pngase en contacto con Zigmund Daniel llamando al [phone] y presione la opcin 4 o envenos un mensaje a travs de Pharmacist, community.   No podemos decirle cul ser su copago por los medicamentos por adelantado ya que esto es diferente dependiendo de la cobertura de su seguro. Sin embargo, es posible que podamos encontrar un medicamento sustituto a Electrical engineer un formulario para que el seguro cubra el medicamento que se considera necesario.   Si se requiere una autorizacin previa para que su compaa de seguros [place] su medicamento, por favor permtanos de 1 a 2 das hbiles para completar este proceso.  Los precios de los medicamentos varan con frecuencia dependiendo del Environmental consultant de dnde se surte la receta y alguna farmacias pueden ofrecer precios ms baratos.  El sitio web www.goodrx.com tiene cupones para medicamentos de Airline pilot. Los precios aqu no tienen en cuenta lo que podra costar con la ayuda del seguro (puede ser ms barato con su seguro), pero el sitio web puede darle el precio si no utiliz Research scientist (physical sciences).  - Puede imprimir el cupn correspondiente y llevarlo con su receta a la farmacia.  - Tambin puede pasar por nuestra oficina durante el horario de atencin regular y Charity fundraiser una tarjeta de cupones de GoodRx.  - Si necesita que su receta se enve electrnicamente a una farmacia diferente, informe a nuestra oficina a travs de MyChart de [place] o por telfono llamando al [phone] y presione la opcin 4.

## 2022-03-13 ENCOUNTER — Encounter: Payer: Self-pay | Admitting: Dermatology

## 2023-03-18 ENCOUNTER — Ambulatory Visit: Payer: BC Managed Care – PPO | Admitting: Dermatology

## 2023-05-20 ENCOUNTER — Encounter: Payer: Self-pay | Admitting: Dermatology

## 2023-05-20 ENCOUNTER — Ambulatory Visit: Payer: BC Managed Care – PPO | Admitting: Dermatology

## 2023-05-20 DIAGNOSIS — D239 Other benign neoplasm of skin, unspecified: Secondary | ICD-10-CM

## 2023-05-20 DIAGNOSIS — L708 Other acne: Secondary | ICD-10-CM

## 2023-05-20 DIAGNOSIS — D2362 Other benign neoplasm of skin of left upper limb, including shoulder: Secondary | ICD-10-CM

## 2023-05-20 DIAGNOSIS — L821 Other seborrheic keratosis: Secondary | ICD-10-CM

## 2023-05-20 DIAGNOSIS — L814 Other melanin hyperpigmentation: Secondary | ICD-10-CM

## 2023-05-20 DIAGNOSIS — W908XXA Exposure to other nonionizing radiation, initial encounter: Secondary | ICD-10-CM | POA: Diagnosis not present

## 2023-05-20 DIAGNOSIS — D1801 Hemangioma of skin and subcutaneous tissue: Secondary | ICD-10-CM

## 2023-05-20 DIAGNOSIS — Z1283 Encounter for screening for malignant neoplasm of skin: Secondary | ICD-10-CM | POA: Diagnosis not present

## 2023-05-20 DIAGNOSIS — D229 Melanocytic nevi, unspecified: Secondary | ICD-10-CM

## 2023-05-20 DIAGNOSIS — L578 Other skin changes due to chronic exposure to nonionizing radiation: Secondary | ICD-10-CM

## 2023-05-20 DIAGNOSIS — Z86018 Personal history of other benign neoplasm: Secondary | ICD-10-CM

## 2023-05-20 DIAGNOSIS — Z872 Personal history of diseases of the skin and subcutaneous tissue: Secondary | ICD-10-CM

## 2023-05-20 NOTE — Progress Notes (Signed)
Follow-Up Visit   Subjective  David Pope is a 42 y.o. male who presents for the following: Skin Cancer Screening and Full Body Skin Exam  The patient presents for Total-Body Skin Exam (TBSE) for skin cancer screening and mole check. The patient has spots, moles and lesions to be evaluated, some may be new or changing and the patient may have concern these could be cancer.  Patient with hx of dysplastic nevus, AK's.  The following portions of the chart were reviewed this encounter and updated as appropriate: medications, allergies, medical history  Review of Systems:  No other skin or systemic complaints except as noted in HPI or Assessment and Plan.  Objective  Well appearing patient in no apparent distress; mood and affect are within normal limits.  A full examination was performed including scalp, head, eyes, ears, nose, lips, neck, chest, axillae, abdomen, back, buttocks, bilateral upper extremities, bilateral lower extremities, hands, feet, fingers, toes, fingernails, and toenails. All findings within normal limits unless otherwise noted below.   Relevant physical exam findings are noted in the Assessment and Plan.  Right Upper Back Dilated pore    Assessment & Plan   SKIN CANCER SCREENING PERFORMED TODAY.  ACTINIC DAMAGE - Chronic condition, secondary to cumulative UV/sun exposure - diffuse scaly erythematous macules with underlying dyspigmentation - Recommend daily broad spectrum sunscreen SPF 30+ to sun-exposed areas, reapply every 2 hours as needed.  - Staying in the shade or wearing long sleeves, sun glasses (UVA+UVB protection) and wide brim hats (4-inch brim around the entire circumference of the hat) are also recommended for sun protection.  - Call for new or changing lesions.  LENTIGINES, SEBORRHEIC KERATOSES, HEMANGIOMAS - Benign normal skin lesions - Benign-appearing - Call for any changes  MELANOCYTIC NEVI - Tan-brown and/or pink-flesh-colored symmetric  macules and papules - Benign appearing on exam today - Observation - Call clinic for new or changing moles - Recommend daily use of broad spectrum spf 30+ sunscreen to sun-exposed areas.   History of Dysplastic Nevus. Above right lateral waistline. Mod-severe atypia, shave removal, margins free. 03/07/2020. - No evidence of recurrence today - Recommend regular full body skin exams - Recommend daily broad spectrum sunscreen SPF 30+ to sun-exposed areas, reapply every 2 hours as needed.  - Call if any new or changing lesions are noted between office visits   LIPOMA VS CYST 1.5 x 1.0 cm rubbery papule at left posterior shoulder/deltoid  Benign-appearing.  Observation.  Call clinic for new or changing lesions.  Recommend daily use of broad spectrum spf 30+ sunscreen to sun-exposed areas.   Dilated pore of [place] Right Upper Back  Benign-appearing.  Observation.  Call clinic for new or changing lesions.  Recommend daily use of broad spectrum spf 30+ sunscreen to sun-exposed areas.    DERMATOFIBROMA Exam: Firm pink/brown papulenodule with dimple sign at left upper arm, left deltoid. Treatment Plan: A dermatofibroma is a benign growth possibly related to trauma, such as an insect bite, cut from shaving, or inflamed acne-type bump.  Treatment options to remove include shave or excision with resulting scar and risk of recurrence.  Since benign-appearing and not bothersome, will observe for now.   HISTORY OF PRECANCEROUS ACTINIC KERATOSIS - site(s) of PreCancerous Actinic Keratosis clear today. - these may recur and new lesions may form requiring treatment to prevent transformation into skin cancer - observe for new or changing spots and contact [facility] for appointment if occur - photoprotection with sun protective clothing; sunglasses and  broad spectrum sunscreen with SPF of at least 30 + and frequent self skin exams recommended - yearly exams by a dermatologist recommended for  persons with history of PreCancerous Actinic Keratoses  Return in about 1 year (around 05/19/2024) for Hx AK, Hx Dysplastic Nevi, TBSE.  Anise Salvo, RMA, am acting as scribe for Armida Sans, MD .   Documentation: I have reviewed the above documentation for accuracy and completeness, and I agree with the above.  Armida Sans, MD

## 2023-05-20 NOTE — Patient Instructions (Signed)

## 2023-05-26 ENCOUNTER — Encounter: Payer: Self-pay | Admitting: Dermatology

## 2024-06-01 ENCOUNTER — Ambulatory Visit: Payer: BC Managed Care – PPO | Admitting: Dermatology

## 2024-06-13 ENCOUNTER — Ambulatory Visit: Admitting: Dermatology

## 2024-06-21 ENCOUNTER — Ambulatory Visit: Admitting: Dermatology

## 2024-06-21 ENCOUNTER — Encounter: Payer: Self-pay | Admitting: Dermatology

## 2024-06-21 DIAGNOSIS — L570 Actinic keratosis: Secondary | ICD-10-CM | POA: Diagnosis not present

## 2024-06-21 DIAGNOSIS — L739 Follicular disorder, unspecified: Secondary | ICD-10-CM

## 2024-06-21 DIAGNOSIS — W908XXA Exposure to other nonionizing radiation, initial encounter: Secondary | ICD-10-CM

## 2024-06-21 DIAGNOSIS — L719 Rosacea, unspecified: Secondary | ICD-10-CM

## 2024-06-21 DIAGNOSIS — D1801 Hemangioma of skin and subcutaneous tissue: Secondary | ICD-10-CM

## 2024-06-21 DIAGNOSIS — Z1283 Encounter for screening for malignant neoplasm of skin: Secondary | ICD-10-CM | POA: Diagnosis not present

## 2024-06-21 DIAGNOSIS — L578 Other skin changes due to chronic exposure to nonionizing radiation: Secondary | ICD-10-CM | POA: Diagnosis not present

## 2024-06-21 DIAGNOSIS — Z7189 Other specified counseling: Secondary | ICD-10-CM

## 2024-06-21 DIAGNOSIS — D229 Melanocytic nevi, unspecified: Secondary | ICD-10-CM

## 2024-06-21 DIAGNOSIS — R238 Other skin changes: Secondary | ICD-10-CM

## 2024-06-21 DIAGNOSIS — D2362 Other benign neoplasm of skin of left upper limb, including shoulder: Secondary | ICD-10-CM

## 2024-06-21 DIAGNOSIS — L814 Other melanin hyperpigmentation: Secondary | ICD-10-CM | POA: Diagnosis not present

## 2024-06-21 DIAGNOSIS — Z79899 Other long term (current) drug therapy: Secondary | ICD-10-CM

## 2024-06-21 DIAGNOSIS — D239 Other benign neoplasm of skin, unspecified: Secondary | ICD-10-CM

## 2024-06-21 DIAGNOSIS — Z86018 Personal history of other benign neoplasm: Secondary | ICD-10-CM

## 2024-06-21 NOTE — Patient Instructions (Addendum)
 Instructions for [facility] Medications  One or more of your medications was sent to the [facility] mail order compounding pharmacy. You will receive an email from them and can purchase the medicine through that link. It will then be mailed to your home at the address you confirmed. If for any reason you do not receive an email from them, please check your spam folder. If you still do not find the email, please let us  know. [facility] phone number is [phone].    Cryotherapy Aftercare  Wash gently with soap and water  everyday.   Apply Vaseline and Band-Aid daily until healed.    Due to recent changes in healthcare laws, you may see results of your pathology and/or laboratory studies on MyChart before the doctors have had a chance to review them. We understand that in some cases there may be results that are confusing or concerning to you. Please understand that not all results are received at the same time and often the doctors may need to interpret multiple results in order to provide you with the best plan of care or course of treatment. Therefore, we ask that you please give us  2 business days to thoroughly review all your results before contacting the office for clarification. Should we see a critical lab result, you will be contacted sooner.   If You Need Anything After Your Visit  If you have any questions or concerns for your doctor, please call our main line at [phone] and press option 4 to reach your doctor's medical assistant. If no one answers, please leave a voicemail as directed and we will return your call as soon as possible. Messages left after 4 pm will be answered the following business day.   You may also send us  a message via MyChart. We typically respond to MyChart messages within 1-2 business days.  For prescription refills, please ask your pharmacy to contact our office. Our fax number is [phone].  If you have an urgent issue when the clinic  is closed that cannot wait until the next business day, you can page your doctor at the number below.    Please note that while we do our best to be available for urgent issues outside of office hours, we are not available 24/7.   If you have an urgent issue and are unable to reach us , you may choose to seek medical care at your doctor's office, retail clinic, urgent care center, or emergency room.  If you have a medical emergency, please immediately call 911 or go to the emergency department.  Pager Numbers  - Dr. Hester: [phone]  - Dr. Jackquline: [phone]  - Dr. Claudene: [phone]   - Dr. Raymund: [phone]  In the event of inclement weather, please call our main line at [phone] for an update on the status of any delays or closures.  Dermatology Medication Tips: Please keep the boxes that topical medications come in in order to help keep track of the instructions about where and how to use these. Pharmacies typically print the medication instructions only on the boxes and not directly on the medication tubes.   If your medication is too expensive, please contact our office at [phone] option 4 or send us  a message through MyChart.   We are unable to tell what your co-pay for medications will be in advance as this is different depending on your insurance coverage. However, we may be able to find a substitute medication at lower cost or fill out  paperwork to get insurance to cover a needed medication.   If a prior authorization is required to get your medication covered by your insurance company, please allow us  1-2 business days to complete this process.  Drug prices often vary depending on where the prescription is filled and some pharmacies may offer cheaper prices.  The website www.goodrx.com contains coupons for medications through different pharmacies. The prices here do not account for what the cost may be with help from insurance (it may be cheaper with your  insurance), but the website can give you the price if you did not use any insurance.  - You can print the associated coupon and take it with your prescription to the pharmacy.  - You may also stop by our office during regular business hours and pick up a GoodRx coupon card.  - If you need your prescription sent electronically to a different pharmacy, notify our office through [facility] or by phone at [phone] option 4.     Si Usted Necesita Algo Despus de Su Visita  Tambin puede enviarnos un mensaje a travs de Clinical cytogeneticist. Por lo general respondemos a los mensajes de MyChart en el transcurso de 1 a 2 das hbiles.  Para renovar recetas, por favor pida a su farmacia que se ponga en contacto con nuestra oficina. Randi lakes de fax es [place] [phone].  Si tiene un asunto urgente cuando la clnica est cerrada y que no puede esperar hasta el siguiente da hbil, puede llamar/localizar a su doctor(a) al nmero que aparece a continuacin.   Por favor, tenga en cuenta que aunque hacemos todo lo posible para estar disponibles para asuntos urgentes fuera del horario de [place], no estamos disponibles las 24 horas del da, los 7 [address]  [address] de la [place].   Si tiene un problema urgente y no puede comunicarse con nosotros, puede optar por buscar atencin mdica  en el consultorio de su doctor(a), en una clnica privada, en un centro de atencin urgente o en una sala de emergencias.  Si tiene Engineer, drilling, por favor llame inmediatamente al 911 o vaya a la sala de emergencias.  Nmeros de bper  - Dr. Hester: [phone]  - Dra. Jackquline: [phone]  - Dr. Claudene: [phone]  - Dra. Kitts: [phone]  En caso de inclemencias del [place], por favor llame a nuestra lnea principal al [phone] para una actualizacin sobre el [place] de cualquier retraso o cierre.  Consejos para la medicacin en dermatologa: Por favor, guarde las cajas en las que vienen los medicamentos  de uso tpico para ayudarle a seguir las instrucciones sobre dnde y cmo usarlos. Las farmacias generalmente imprimen las instrucciones del medicamento slo en las cajas y no directamente en los tubos del [place].   Si su medicamento es muy caro, por favor, pngase en contacto con landry rieger llamando al [phone] y presione la opcin 4 o envenos un mensaje a travs de Clinical cytogeneticist.   No podemos decirle cul ser su copago por los medicamentos por adelantado ya que esto es diferente dependiendo de la cobertura de su seguro. Sin embargo, es posible que podamos encontrar un medicamento sustituto a Audiological scientist un formulario para que el seguro cubra el medicamento que se considera necesario.   Si se requiere una autorizacin previa para que su compaa de seguros malta su medicamento, por favor permtanos de 1 a 2 das hbiles para completar este proceso.  Los precios de los medicamentos varan con frecuencia dependiendo del Environmental consultant de dnde se  surte la receta y alguna farmacias pueden ofrecer precios ms baratos.  El sitio web www.goodrx.com tiene cupones para medicamentos de Health and safety inspector. Los precios aqu no tienen en cuenta lo que podra costar con la ayuda del seguro (puede ser ms barato con su seguro), pero el sitio web puede darle el precio si no utiliz Tourist information centre manager.  - Puede imprimir el cupn correspondiente y llevarlo con su receta a la farmacia.  - Tambin puede pasar por nuestra oficina durante el horario de atencin regular y Education officer, museum una tarjeta de cupones de GoodRx.  - Si necesita que su receta se enve electrnicamente a una farmacia diferente, informe a nuestra oficina a travs de MyChart de [place] o por telfono llamando al [phone] y presione la opcin 4.

## 2024-06-21 NOTE — Progress Notes (Signed)
 Follow-Up Visit   Subjective  David Pope is a 43 y.o. male who presents for the following: Skin Cancer Screening and Full Body Skin Exam, hx of Dysplastic nevus   The patient presents for Total-Body Skin Exam (TBSE) for skin cancer screening and mole check. The patient has spots, moles and lesions to be evaluated, some may be new or changing and the patient may have concern these could be cancer.  The following portions of the chart were reviewed this encounter and updated as appropriate: medications, allergies, medical history  Review of Systems:  No other skin or systemic complaints except as noted in HPI or Assessment and Plan.  Objective  Well appearing patient in no apparent distress; mood and affect are within normal limits.  A full examination was performed including scalp, head, eyes, ears, nose, lips, neck, chest, axillae, abdomen, back, buttocks, bilateral upper extremities, bilateral lower extremities, hands, feet, fingers, toes, fingernails, and toenails. All findings within normal limits unless otherwise noted below.   Relevant physical exam findings are noted in the Assessment and Plan.  right ear helix x 1 Erythematous thin papules/macules with gritty scale.   Assessment & Plan   SKIN CANCER SCREENING PERFORMED TODAY.  LENTIGINES, SEBORRHEIC KERATOSES, HEMANGIOMAS - Benign normal skin lesions - Benign-appearing - Call for any changes  MELANOCYTIC NEVI - Tan-brown and/or pink-flesh-colored symmetric macules and papules - Benign appearing on exam today - Observation - Call clinic for new or changing moles - Recommend daily use of broad spectrum spf 30+ sunscreen to sun-exposed areas.   LIPOMA VS CYST 1.5 x 1.0 cm rubbery papule at left posterior shoulder/deltoid Benign-appearing.  Observation.  Call clinic for new or changing lesions.  Recommend daily use of broad spectrum spf 30+ sunscreen to sun-exposed areas.    Dilated pore of Winer Right Upper  Back Benign-appearing.  Observation.  Call clinic for new or changing lesions.  Recommend daily use of broad spectrum spf 30+ sunscreen to sun-exposed areas.      DERMATOFIBROMA Exam: Firm pink/brown papulenodule with dimple sign at left upper arm, left deltoid. Treatment Plan: A dermatofibroma is a benign growth possibly related to trauma, such as an insect bite, cut from shaving, or inflamed acne-type bump.  Treatment options to remove include shave or excision with resulting scar and risk of recurrence.  Since benign-appearing and not bothersome, will observe for now.   ROSACEA Exam Mid face erythema with telangiectasias +/- scattered inflammatory papules  Chronic and persistent condition with duration or expected duration over one year. Condition is symptomatic/ bothersome to patient. Not currently at goal.  Rosacea is a chronic progressive skin condition usually affecting the face of adults, causing redness and/or acne bumps. It is treatable but not curable. It sometimes affects the eyes (ocular rosacea) as well. It may respond to topical and/or systemic medication and can flare with stress, sun exposure, alcohol, exercise, topical steroids (including hydrocortisone/cortisone 10) and some foods.  Daily application of broad spectrum spf 30+ sunscreen to face is recommended to reduce flares. Treatment Plan Start Skin medicinals Rosacea triple cream apply to face once a day    HISTORY OF DYSPLASTIC NEVUS No evidence of recurrence today Recommend regular full body skin exams Recommend daily broad spectrum sunscreen SPF 30+ to sun-exposed areas, reapply every 2 hours as needed.  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  - Recommend staying in the shade or wearing long sleeves, sun glasses (UVA+UVB protection) and wide brim hats (4-inch brim around the entire circumference of the hat). - Call for new or changing lesions.   Advised today if anything remains after 2 months to contact the office and return for reevaluation. Destruction of lesion - right ear helix x 1 Complexity: simple   Destruction method: cryotherapy   Informed consent: discussed and consent obtained   Timeout:  patient name, date of birth, surgical site, and procedure verified Lesion destroyed using liquid nitrogen: Yes   Region frozen until ice ball extended beyond lesion: Yes   Outcome: patient tolerated procedure well with no complications   Post-procedure details: wound care instructions given    SKIN CANCER SCREENING   HISTORY OF DYSPLASTIC NEVUS   Related Medications mupirocin  ointment (BACTROBAN ) 2 % Apply to skin qd-bid ACTINIC SKIN DAMAGE   LENTIGO   MELANOCYTIC NEVUS, UNSPECIFIED LOCATION   ROSACEA   COUNSELING AND COORDINATION OF CARE   MEDICATION MANAGEMENT   DERMATOFIBROMA    Return in about 1 year (around 06/21/2025) for TBSE, hx of Dysplastic nevus .  IFay Kirks, CMA, am acting as scribe for Alm Rhyme, MD .   Documentation: I have reviewed the above documentation for accuracy and completeness, and I agree with the above.  Alm Rhyme, MD

## 2024-07-19 NOTE — H&P (View-Only) (Signed)
 Subjective:  CC: Hematochezia [K92.1]   HPI:  referrred by Reyes JONETTA Costa, MD for above.   History of Present Illness David Pope is a 43 year old male with type 2 diabetes who presents with rectal bleeding and pain during bowel movements.  He experiences sharp left-sided pain during bowel movements, coinciding with the stool reaching maximum capacity. This pain is followed by the passage of stool and bright red blood on the toilet paper. The stool is brown with a mucous-like coating. These episodes have occurred four to five times. Bleeding is typically accompanied by pain, except for the last episode with softer stool and no pain. There is no bleeding between bowel movements or in his underwear. He takes Mounjaro 15 mg for type 2 diabetes, which has slowed his bowel movements, resulting in harder and possibly larger stools.    Past Medical History:  has a past medical history of Benign cyst of testis, Diabetes mellitus type 2, uncomplicated (CMS/HHS-HCC), Fatty infiltration of liver, Hematuria, and Splenomegaly.  Past Surgical History:  has a past surgical history that includes RIGHT SHOULDER SURGERY (01/1999); ARTHROSCOPIC RIGHT MENISCUS TEAR (08/2000); Knee arthroscopy; and collar bone surgery.  Family History: family history is not on file. He was adopted.  Social History:  reports that he has never smoked. He has been exposed to tobacco smoke. He has never used smokeless tobacco. He reports current alcohol use. He reports that he does not use drugs.  Current Medications: has a current medication list which includes the following prescription(s): freestyle libre 3 plus sensor, hydroxyzine, losartan, mounjaro, and glucose monitoring kit.  Allergies:  Allergies as of 07/19/2024 - Reviewed 07/19/2024  Allergen Reaction Noted  . Bee pollen Other (See Comments) 08/23/2013  . Neosporin [neomycin-bacitracin-polymyxin] Unknown 06/30/2014  . Other Other (See Comments) 01/18/2016    ROS:   A 15 point review of systems was performed and pertinent positives and negatives noted in HPI  Objective:     BP 115/78   Pulse 78   Ht 180.3 cm (5' 11)   Wt (!) 118.8 kg (262 lb)   BMI 36.54 kg/m   Constitutional :  No distress, cooperative, alert  Lymphatics/Throat:  Supple with no lymphadenopathy  Respiratory:  Clear to auscultation bilaterally  Cardiovascular:  Regular rate and rhythm  Gastrointestinal: Soft, non-tender, non-distended, no organomegaly.  Musculoskeletal: Steady gait and movement  Skin: Cool and moist  Psychiatric: Normal affect, non-agitated, not confused  Rectal: External exam normal.  DRE revealed, normal rectal tone, with palpable internal hemorrhoid noted at anterior portion with minimal discomfort.  After obtaining verbal consent, an anoscope was inserted after prepped with lubricant and internal hemorrhoids noted on DRE confirmed visually, with no evidence of thrombosis. No other pathology such as fissures, fistulas, polyps noted.  Scope withdrawn and patient tolerate procedure well.       LABS:  N/a   RADS: N/a  Assessment:     Hematochezia [K92.1]  hemorrhoids Plan:    1. Hematochezia [K92.1] amenabe to banding but also being close to first screening age for colon CA, will proceed with colonoscopy as well.  Banding will be done afterwards.  Pt will call to schedule when ready to proceed.  R/b/a discussed.  Risks include bleeding, perforation.  Benefits include diagnostic, curative procedure if needed.  Alternatives include continued observation.  Pt verbalized understanding.  labs/images/medications/previous chart entries reviewed personally and relevant changes/updates noted above.

## 2024-08-05 ENCOUNTER — Ambulatory Visit: Admitting: Urology

## 2024-08-05 ENCOUNTER — Encounter: Payer: Self-pay | Admitting: Urology

## 2024-08-05 VITALS — BP 103/71 | HR 87 | Ht 71.0 in | Wt 261.0 lb

## 2024-08-05 DIAGNOSIS — Z87442 Personal history of urinary calculi: Secondary | ICD-10-CM

## 2024-08-05 DIAGNOSIS — R10A2 Flank pain, left side: Secondary | ICD-10-CM

## 2024-08-05 DIAGNOSIS — R3129 Other microscopic hematuria: Secondary | ICD-10-CM

## 2024-08-05 LAB — MICROSCOPIC EXAMINATION

## 2024-08-05 LAB — URINALYSIS, COMPLETE
Bilirubin, UA: NEGATIVE
Glucose, UA: NEGATIVE
Ketones, UA: NEGATIVE
Leukocytes,UA: NEGATIVE
Nitrite, UA: NEGATIVE
Specific Gravity, UA: 1.025 (ref 1.005–1.030)
Urobilinogen, Ur: 0.2 mg/dL (ref 0.2–1.0)
pH, UA: 6 (ref 5.0–7.5)

## 2024-08-05 NOTE — Patient Instructions (Signed)
 Scheduling number: [phone]

## 2024-08-05 NOTE — Progress Notes (Signed)
   08/05/2024 10:03 AM   David Pope 06-17-1981 [id number]  Referring provider: Auston Reyes BIRCH, MD [address] [facility] [place],  [place] [address]  Chief Complaint  Patient presents with   New Patient (Initial Visit)   Establish Care   Nephrolithiasis    HPI: David Pope is a 43 y.o. male referred for evaluation of nephrolithiasis  States for the past 3 years he has had episodes of flank pain and nausea.  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  Home Medications:  Allergies as of 08/05/2024       Reactions   Neosporin Original  [bacitracin-neomycin-polymyxin]    Pollen Extract         Medication List        Accurate as of August 05, 2024 10:03 AM. If you have any questions, ask your nurse or doctor.          glucose monitoring kit monitoring kit Use as directed   ibuprofen 200 MG tablet Commonly known as: ADVIL Take by mouth.   losartan 50 MG tablet Commonly known as: COZAAR Take 50 mg by mouth daily.   metFORMIN 1000 MG tablet Commonly known as: GLUCOPHAGE Take 1,000 mg by mouth 2 (two) times daily.   mupirocin  ointment 2 % Commonly known as: BACTROBAN  Apply to skin qd-bid        Allergies:  Allergies  Allergen Reactions   Neosporin Original  [Bacitracin-Neomycin-Polymyxin]    Pollen Extract     Family History: No family history on  file.  Social History:  has no history on file for tobacco use, alcohol use, and drug use.   Physical Exam: BP 103/71 (BP Location: Left Arm, Patient Position: Sitting, Cuff Size: Large)   Pulse 87   Ht 5' 11 (1.803 m)   Wt 261 lb (118.4 kg)   SpO2 96%   BMI 36.40 kg/m   Constitutional:  Alert, No acute distress. HEENT: [place] AT Respiratory: Normal respiratory effort, no increased work of breathing. Psychiatric: Normal mood and affect.  Laboratory Data:  Urinalysis Dipstick 1+ protein/2+ blood Microscopy 3-10 RBC   Assessment & Plan:    1.  History of nephrolithiasis Recurrent flank pain with previous stone passage Schedule CT renal stone study Will notify with results and further recommendations   Glendia JAYSON Barba, MD  [facility] [address], [address] [place], [place] [address] [phone]

## 2024-08-09 ENCOUNTER — Encounter: Payer: Self-pay | Admitting: Surgery

## 2024-08-10 ENCOUNTER — Encounter: Payer: Self-pay | Admitting: Surgery

## 2024-08-10 ENCOUNTER — Encounter
Admission: RE | Disposition: A | Discharge status: Home / Self Care | Payer: Self-pay | Source: Home / Self Care | Attending: Surgery

## 2024-08-10 ENCOUNTER — Encounter: Admission: RE | Payer: Self-pay

## 2024-08-10 ENCOUNTER — Ambulatory Visit
Admission: RE | Admit: 2024-08-10 | Discharge: 2024-08-10 | Disposition: A | Discharge status: Home / Self Care | Attending: Surgery | Admitting: Surgery

## 2024-08-10 ENCOUNTER — Ambulatory Visit: Admitting: Certified Registered"

## 2024-08-10 DIAGNOSIS — K5730 Diverticulosis of large intestine without perforation or abscess without bleeding: Secondary | ICD-10-CM | POA: Diagnosis not present

## 2024-08-10 DIAGNOSIS — I10 Essential (primary) hypertension: Secondary | ICD-10-CM | POA: Insufficient documentation

## 2024-08-10 DIAGNOSIS — Z79899 Other long term (current) drug therapy: Secondary | ICD-10-CM | POA: Insufficient documentation

## 2024-08-10 DIAGNOSIS — K640 First degree hemorrhoids: Secondary | ICD-10-CM | POA: Diagnosis not present

## 2024-08-10 DIAGNOSIS — E119 Type 2 diabetes mellitus without complications: Secondary | ICD-10-CM | POA: Diagnosis not present

## 2024-08-10 DIAGNOSIS — K921 Melena: Secondary | ICD-10-CM | POA: Diagnosis present

## 2024-08-10 DIAGNOSIS — Z7722 Contact with and (suspected) exposure to environmental tobacco smoke (acute) (chronic): Secondary | ICD-10-CM | POA: Insufficient documentation

## 2024-08-10 DIAGNOSIS — Z7985 Long-term (current) use of injectable non-insulin antidiabetic drugs: Secondary | ICD-10-CM | POA: Diagnosis not present

## 2024-08-10 HISTORY — DX: Essential (primary) hypertension: I10

## 2024-08-10 HISTORY — PX: COLONOSCOPY: SHX5424

## 2024-08-10 LAB — GLUCOSE, CAPILLARY: Glucose-Capillary: 111 mg/dL — ABNORMAL HIGH (ref 70–99)

## 2024-08-10 SURGERY — COLONOSCOPY
Anesthesia: General

## 2024-08-10 MED ORDER — PROPOFOL 10 MG/ML IV BOLUS
INTRAVENOUS | Status: AC
  Filled 2024-08-10: qty 40

## 2024-08-10 MED ORDER — LIDOCAINE HCL (CARDIAC) PF 100 MG/5ML IV SOSY
PREFILLED_SYRINGE | INTRAVENOUS | Status: DC | PRN
  Administered 2024-08-10: 100 mg via INTRAVENOUS

## 2024-08-10 MED ORDER — SODIUM CHLORIDE 0.9 % IV SOLN: INTRAVENOUS | Status: DC

## 2024-08-10 MED ORDER — LIDOCAINE HCL (PF) 2 % IJ SOLN
INTRAMUSCULAR | Status: AC
  Filled 2024-08-10: qty 5

## 2024-08-10 MED ORDER — PROPOFOL 500 MG/50ML IV EMUL
INTRAVENOUS | Status: DC | PRN
  Administered 2024-08-10: 90 ug/kg/min via INTRAVENOUS
  Administered 2024-08-10: 100 mg via INTRAVENOUS

## 2024-08-10 MED ORDER — PHENYLEPHRINE 80 MCG/ML (10ML) SYRINGE FOR IV PUSH (FOR BLOOD PRESSURE SUPPORT)
PREFILLED_SYRINGE | INTRAVENOUS | Status: DC | PRN
  Administered 2024-08-10 (×3): 80 ug via INTRAVENOUS

## 2024-08-10 MED ORDER — PHENYLEPHRINE 80 MCG/ML (10ML) SYRINGE FOR IV PUSH (FOR BLOOD PRESSURE SUPPORT)
PREFILLED_SYRINGE | INTRAVENOUS | Status: AC
  Filled 2024-08-10: qty 10

## 2024-08-10 NOTE — Op Note (Addendum)
 [facility] Gastroenterology Patient Name: David Pope Procedure Date: 08/10/2024 7:19 AM MRN: [id number] Account #: [id number] Date of Birth: 03/29/1981 Admit Type: Outpatient Age: 43 Room: [facility] ENDO ROOM 1 Gender: Male Note Status: Finalized Instrument Name: Colon Scope [id number] Procedure:             Colonoscopy Indications:           Hematochezia Providers:             Henriette Pierre MD, MD Referring MD:          Reyes BIRCH. Auston, MD (Referring MD) Medicines:             Propofol per Anesthesia Complications:         No immediate complications. Procedure:             Pre-Anesthesia Assessment:                        - After reviewing the risks and benefits, the patient                         was deemed in satisfactory condition to undergo the                         procedure in an ambulatory setting.                        After obtaining informed consent, the colonoscope was                         passed under direct vision. Throughout the procedure,                         the patient's blood pressure, pulse, and oxygen                         saturations were monitored continuously. The                         Colonoscope was introduced through the anus and                         advanced to the the cecum, identified by the ileocecal                         valve. The colonoscopy was somewhat difficult due to a                         redundant colon. Successful completion of the                         procedure was aided by applying abdominal pressure. Findings:      The perianal and digital rectal examinations were normal.      A few small-mouthed diverticula were found in the sigmoid colon.      Non-bleeding internal hemorrhoids were found during retroflexion. The       hemorrhoids were Grade I (internal hemorrhoids that do not prolapse). Impression:            - Diverticulosis in the sigmoid colon.                        -  Non-bleeding internal  hemorrhoids.                        - No specimens collected. Recommendation:        - Repeat colonoscopy in 10 years for screening                         purposes.                        - Written discharge instructions were provided to the                         patient.                        - Discharge patient to home.                        - Resume previous diet. Procedure Code(s):     --- Professional ---                        [phone], Colonoscopy, flexible; diagnostic, including                         collection of specimen(s) by brushing or washing, when                         performed (separate procedure) Diagnosis Code(s):     --- Professional ---                        K92.1, Melena (includes Hematochezia)                        K64.0, First degree hemorrhoids                        K57.30, Diverticulosis of large intestine without                         perforation or abscess without bleeding CPT copyright 2022 American Medical Association. All rights reserved. The codes documented in this report are preliminary and upon coder review may  be revised to meet current compliance requirements. Dr. Henriette Sevin, MD Henriette Pierre MD, MD 08/10/2024 8:06:18 AM This report has been signed electronically. Number of Addenda: 0 Note Initiated On: 08/10/2024 7:19 AM Scope Withdrawal Time: 0 hours 9 minutes 54 seconds  Total Procedure Duration: 0 hours 19 minutes 37 seconds  Estimated Blood Loss:  Estimated blood loss: none.      [facility]

## 2024-08-10 NOTE — Anesthesia Postprocedure Evaluation (Signed)
 Anesthesia Post Note  Patient: David Pope  Procedure(s) Performed: COLONOSCOPY  Patient location during evaluation: PACU Anesthesia Type: General Level of consciousness: awake Pain management: pain level controlled Vital Signs Assessment: post-procedure vital signs reviewed and stable Cardiovascular status: stable Anesthetic complications: no   No notable events documented.   Last Vitals:  Vitals:   08/10/24 0812 08/10/24 0822  BP: 105/62 112/78  Pulse: 62 63  Resp: 20 18  Temp:    SpO2: 99% 99%    Last Pain:  Vitals:   08/10/24 0804  TempSrc:   PainSc: 0-No pain                 VAN STAVEREN,Kendarious Gudino

## 2024-08-10 NOTE — Interval H&P Note (Signed)
 History and Physical Interval Note:  08/10/2024 7:11 AM  David Pope  has presented today for surgery, with the diagnosis of K92.1 Hematochezia K64.0 Hemrrhoids.  The various methods of treatment have been discussed with the patient and family. After consideration of risks, benefits and other options for treatment, the patient has consented to  Procedure(s): COLONOSCOPY (N/A) as a surgical intervention.  The patient's history has been reviewed, patient examined, no change in status, stable for surgery.  I have reviewed the patient's chart and labs.  Questions were answered to the patient's satisfaction.     [name]

## 2024-08-10 NOTE — Anesthesia Preprocedure Evaluation (Signed)
 Anesthesia Evaluation  Patient identified by MRN, date of birth, ID band Patient awake, Patient confused and Patient unresponsive    Reviewed: Allergy & Precautions, NPO status , Patient's Chart, lab work & pertinent test results  Airway Mallampati: II  TM Distance: >3 FB Neck ROM: Full    Dental  (+) Teeth Intact   Pulmonary neg pulmonary ROS   Pulmonary exam normal        Cardiovascular Exercise Tolerance: Good hypertension, Pt. on medications negative cardio ROS Normal cardiovascular exam Rhythm:Regular Rate:Normal     Neuro/Psych negative neurological ROS  negative psych ROS   GI/Hepatic negative GI ROS, Neg liver ROS,,,  Endo/Other  negative endocrine ROS  Class 3 obesity  Renal/GU negative Renal ROS  negative genitourinary   Musculoskeletal negative musculoskeletal ROS (+)    Abdominal  (+) + obese  Peds negative pediatric ROS (+)  Hematology negative hematology ROS (+)   Anesthesia Other Findings Past Medical History: 03/07/2020: Atypical mole     Comment:  above R lateral waistline/mod to severe Atypia, shave               removal, margins free No date: Hypertension  Past Surgical History: No date: CLAVICLE SURGERY 2001: MENISCUS REPAIR; Right 2000: SHOULDER SURGERY; Right  BMI    Body Mass Index: 36.96 kg/m      Reproductive/Obstetrics negative OB ROS                              Anesthesia Physical Anesthesia Plan  ASA: 3  Anesthesia Plan: General   Post-op Pain Management:    Induction: Intravenous  PONV Risk Score and Plan: Propofol infusion and TIVA  Airway Management Planned: Natural Airway and Nasal Cannula  Additional Equipment:   Intra-op Plan:   Post-operative Plan:   Informed Consent: I have reviewed the patients History and Physical, chart, labs and discussed the procedure including the risks, benefits and alternatives for the proposed  anesthesia with the patient or authorized representative who has indicated his/her understanding and acceptance.     Dental Advisory Given  Plan Discussed with: CRNA  Anesthesia Plan Comments:         Anesthesia Quick Evaluation

## 2024-08-10 NOTE — Transfer of Care (Signed)
 Immediate Anesthesia Transfer of Care Note  Patient: David Pope  Procedure(s) Performed: COLONOSCOPY  Patient Location: PACU  Anesthesia Type:General  Level of Consciousness: awake, alert , and oriented  Airway & Oxygen Therapy: Patient Spontanous Breathing  Post-op Assessment: Report given to RN and Post -op Vital signs reviewed and stable  Post vital signs: Reviewed and stable  Last Vitals:  Vitals Value Taken Time  BP 107/64 08/10/24 08:04  Temp 35.8 C 08/10/24 08:01  Pulse 73 08/10/24 08:03  Resp 12 08/10/24 08:03  SpO2 100 % 08/10/24 08:03  Vitals shown include unfiled device data.  Last Pain:  Vitals:   08/10/24 0801  TempSrc: Temporal  PainSc:          Complications: No notable events documented.

## 2025-06-27 ENCOUNTER — Ambulatory Visit: Admitting: Dermatology
